# Patient Record
Sex: Female | Born: 1985 | Race: Black or African American | Hispanic: No | Marital: Single | Smoking: Never smoker
Health system: Southern US, Community
[De-identification: ages and names within clinical notes are randomized; demographics above are authoritative.]

## PROBLEM LIST (undated history)

## (undated) DIAGNOSIS — I10 Essential (primary) hypertension: Secondary | ICD-10-CM

## (undated) HISTORY — PX: TONSILLECTOMY: SUR1361

## (undated) HISTORY — PX: TUBAL LIGATION: SHX77

---

## 2015-12-17 ENCOUNTER — Emergency Department
Admission: EM | Admit: 2015-12-17 | Discharge: 2015-12-18 | Disposition: A | Discharge status: Home / Self Care | Payer: Medicaid Other | Attending: Emergency Medicine | Admitting: Emergency Medicine

## 2015-12-17 ENCOUNTER — Encounter: Payer: Self-pay | Admitting: Emergency Medicine

## 2015-12-17 DIAGNOSIS — K0889 Other specified disorders of teeth and supporting structures: Secondary | ICD-10-CM

## 2015-12-17 DIAGNOSIS — K08409 Partial loss of teeth, unspecified cause, unspecified class: Secondary | ICD-10-CM | POA: Diagnosis not present

## 2015-12-17 DIAGNOSIS — K2970 Gastritis, unspecified, without bleeding: Secondary | ICD-10-CM | POA: Diagnosis not present

## 2015-12-17 DIAGNOSIS — I10 Essential (primary) hypertension: Secondary | ICD-10-CM | POA: Insufficient documentation

## 2015-12-17 DIAGNOSIS — R112 Nausea with vomiting, unspecified: Secondary | ICD-10-CM | POA: Diagnosis present

## 2015-12-17 HISTORY — DX: Essential (primary) hypertension: I10

## 2015-12-17 NOTE — ED Notes (Addendum)
Pt presents to ED with tooth pain to the bottom left side of her mouth. Pt reports she has a dental appt on Tuesday but feels like she cant wait any longer. Pt also reports she has been vomiting since this morning and is feeling dehydrated. Denies drainage from affected tooth. States otc medications are not helping.

## 2015-12-18 MED ORDER — NAPROXEN 500 MG PO TABS
ORAL_TABLET | ORAL | Status: AC
  Administered 2015-12-18: 500 mg via ORAL
  Filled 2015-12-18: qty 1

## 2015-12-18 MED ORDER — RANITIDINE HCL 150 MG PO CAPS: 150.0000 mg | ORAL_CAPSULE | Freq: Two times a day (BID) | ORAL | Status: DC

## 2015-12-18 MED ORDER — GI COCKTAIL ~~LOC~~
30.0000 mL | Freq: Once | ORAL | Status: AC
  Administered 2015-12-18: 30 mL via ORAL
  Filled 2015-12-18: qty 30

## 2015-12-18 MED ORDER — NAPROXEN 500 MG PO TABS
500.0000 mg | ORAL_TABLET | Freq: Once | ORAL | Status: AC
  Administered 2015-12-18: 500 mg via ORAL

## 2015-12-18 MED ORDER — ONDANSETRON 8 MG PO TBDP: 8.0000 mg | ORAL_TABLET | Freq: Three times a day (TID) | ORAL | Status: DC | PRN

## 2015-12-18 MED ORDER — SUCRALFATE 1 G PO TABS: 1.0000 g | ORAL_TABLET | Freq: Four times a day (QID) | ORAL | Status: DC

## 2015-12-18 MED ORDER — ONDANSETRON 8 MG PO TBDP
8.0000 mg | ORAL_TABLET | Freq: Once | ORAL | Status: AC
  Administered 2015-12-18: 8 mg via ORAL
  Filled 2015-12-18: qty 1

## 2015-12-18 MED ORDER — BENZOCAINE 20 % MT GEL: 1.0000 "application " | Freq: Four times a day (QID) | OROMUCOSAL | Status: DC | PRN

## 2015-12-18 NOTE — ED Notes (Signed)
Pt was resting upon entering room. Pt given fluids and instructed to take small sips slowly. Will monitor pt. Pt denies nausea at this time and has not vomited since medication given.

## 2015-12-18 NOTE — Discharge Instructions (Signed)

## 2015-12-18 NOTE — ED Provider Notes (Signed)
[facility] Emergency Department Provider Note  ____________________________________________  Time seen: 12:00 midnight  I have reviewed the triage vital signs and the nursing notes.   HISTORY  Chief Complaint Dental Pain and Emesis    HPI Makayla Hernandez is a 29 y.o. female who complains of dental pain in the bottom left of her mouth and she has a dental appointment in 2 days but feels like she can't wait any longer. Part of the issue that motivated her presentation tonight is that he says that she's been taking multiple NSAIDs for the past 2 or 3 days including Tylenol and ibuprofen around-the-clock and now today she is very nauseated and has been vomiting and unable to tolerate oral intake. Says she is throwing up "the lining of my stomach". Has any fevers chills or abdominal pain. No chest pain shortness of breath headaches or dizziness. No throat swelling.     Past Medical History  Diagnosis Date  . HTN (hypertension)      There are no active problems to display for this patient.    Past Surgical History  Procedure Laterality Date  . Cesarean section    . Tonsillectomy    . Tubal ligation       Current Outpatient Rx  Name  Route  Sig  Dispense  Refill  . benzocaine (HURRICAINE) 20 % GEL   Mouth/Throat   Use as directed 1 application in the mouth or throat 4 (four) times daily as needed.   5.25 g   0   . ondansetron (ZOFRAN ODT) 8 MG disintegrating tablet   Oral   Take 1 tablet (8 mg total) by mouth every 8 (eight) hours as needed for nausea or vomiting.   20 tablet   0   . ranitidine (ZANTAC) 150 MG capsule   Oral   Take 1 capsule (150 mg total) by mouth 2 (two) times daily.   28 capsule   0   . sucralfate (CARAFATE) 1 G tablet   Oral   Take 1 tablet (1 g total) by mouth 4 (four) times daily.   120 tablet   1      Allergies Review of patient's allergies indicates no known allergies.   No family history on  file.  Social History Social History  Substance Use Topics  . Smoking status: Never Smoker   . Smokeless tobacco: Never Used  . Alcohol Use: No    Review of Systems  Constitutional:   No fever or chills. No weight changes Eyes:   No blurry vision or double vision.  ENT:   No sore throat. Dental pain Cardiovascular:   No chest pain. Respiratory:   No dyspnea or cough. Gastrointestinal:   Negative for abdominal pain, positive vomiting.  No BRBPR or melena. Genitourinary:   Negative for dysuria, urinary retention, bloody urine, or difficulty urinating. Musculoskeletal:   Negative for back pain. No joint swelling or pain. Skin:   Negative for rash. Neurological:   Negative for headaches, focal weakness or numbness. Psychiatric:  No anxiety or depression.   Endocrine:  No hot/cold intolerance, changes in energy, or sleep difficulty.  10-point ROS otherwise negative.  ____________________________________________   PHYSICAL EXAM:  VITAL SIGNS: ED Triage Vitals  Enc Vitals Group     BP 12/17/15 2335 160/91 mmHg     Pulse Rate 12/17/15 2335 81     Resp 12/17/15 2335 18     Temp 12/17/15 2335 98.2 F (36.8 C)     Temp  Source 12/17/15 2335 Oral     SpO2 12/17/15 2335 100 %     Weight 12/17/15 2335 270 lb (122.471 kg)     Height 12/17/15 2335 5\' 7"  (1.702 m)     Head Cir --      Peak Flow --      Pain Score 12/17/15 2341 10     Pain Loc --      Pain Edu? --      Excl. in GC? --     Vital signs reviewed, nursing assessments reviewed.   Constitutional:   Alert and oriented. Well appearing and in no distress. Eyes:   No scleral icterus. No conjunctival pallor. PERRL. EOMI ENT   Head:   Normocephalic and atraumatic.   Nose:   No congestion/rhinnorhea. No septal hematoma   Mouth/Throat:   MMM, no pharyngeal erythema. No peritonsillar mass. No uvula shift. Multiple missing teeth. No swelling or apparent inflammation in the area of the indicated pain. The indicated  tooth and his neighbors are completely well seated stable and nontender. No visible injury. No floor of mouth swelling or edema., 4 mouth is soft to touch.   Neck:   No stridor. No SubQ emphysema. No meningismus. Hematological/Lymphatic/Immunilogical:   No cervical lymphadenopathy. Cardiovascular:   RRR. Normal and symmetric distal pulses are present in all extremities. No murmurs, rubs, or gallops. Respiratory:   Normal respiratory effort without tachypnea nor retractions. Breath sounds are clear and equal bilaterally. No wheezes/rales/rhonchi. Gastrointestinal:   Soft and nontender. No distention. There is no CVA tenderness.  No rebound, rigidity, or guarding. Genitourinary:   deferred Musculoskeletal:   Nontender with normal range of motion in all extremities. No joint effusions.  No lower extremity tenderness.  No edema. Neurologic:   Normal speech and language.  CN 2-10 normal. Motor grossly intact. No pronator drift.  Normal gait. No gross focal neurologic deficits are appreciated.  Skin:    Skin is warm, dry and intact. No rash noted.  No petechiae, purpura, or bullae. Psychiatric:   Mood and affect are normal. Speech and behavior are normal. Patient exhibits appropriate insight and judgment.  ____________________________________________    LABS (pertinent positives/negatives) (all labs ordered are listed, but only abnormal results are displayed) Labs Reviewed - No data to display ____________________________________________   EKG    ____________________________________________    RADIOLOGY    ____________________________________________   PROCEDURES   ____________________________________________   INITIAL IMPRESSION / ASSESSMENT AND PLAN / ED COURSE  Pertinent labs & imaging results that were available during my care of the patient were reviewed by me and considered in my medical decision making (see chart for details).  Patient very well-appearing no  acute distress. No significant dental tenderness or inflammation, no abscess. Patient states that she is very concerned that she is dehydrated and needs IV fluids. On exam there is no evidence of dehydration and she is very well-appearing. I encouraged her to try oral antiemetics and a GI cocktail first to a the patient is quite resistant, but we'll give it a try as she does not appear to warrant IV therapy at this time. I suspect was happened is that with her round-the-clock use of multiple NSAIDs she started to develop a gastritis and is causing the new symptoms. I very low suspicion of any airway optimize or PTA or RPA or secondary space infection.  ----------------------------------------- 2:02 AM on 12/18/2015 -----------------------------------------  Sleeping comfortably. Tolerating oral intake and drinking a full cup of water after antiemetics and  GI cocktail. We'll discharge home, follow up with dentistry.   ____________________________________________   FINAL CLINICAL IMPRESSION(S) / ED DIAGNOSES  Final diagnoses:  Gastritis  Pain, dental      Sharman Cheek, MD 12/18/15 [phone]

## 2016-01-12 ENCOUNTER — Emergency Department
Admission: EM | Admit: 2016-01-12 | Discharge: 2016-01-13 | Disposition: A | Discharge status: Home / Self Care | Payer: Medicaid Other | Attending: Emergency Medicine | Admitting: Emergency Medicine

## 2016-01-12 ENCOUNTER — Other Ambulatory Visit: Payer: Self-pay

## 2016-01-12 DIAGNOSIS — Z79899 Other long term (current) drug therapy: Secondary | ICD-10-CM | POA: Diagnosis not present

## 2016-01-12 DIAGNOSIS — H53149 Visual discomfort, unspecified: Secondary | ICD-10-CM | POA: Diagnosis not present

## 2016-01-12 DIAGNOSIS — I10 Essential (primary) hypertension: Secondary | ICD-10-CM | POA: Insufficient documentation

## 2016-01-12 DIAGNOSIS — R51 Headache: Secondary | ICD-10-CM | POA: Diagnosis present

## 2016-01-12 DIAGNOSIS — R110 Nausea: Secondary | ICD-10-CM | POA: Diagnosis not present

## 2016-01-12 DIAGNOSIS — R519 Headache, unspecified: Secondary | ICD-10-CM

## 2016-01-12 DIAGNOSIS — F419 Anxiety disorder, unspecified: Secondary | ICD-10-CM | POA: Diagnosis not present

## 2016-01-12 LAB — BASIC METABOLIC PANEL
ANION GAP: 8 (ref 5–15)
BUN: 9 mg/dL (ref 6–20)
CHLORIDE: 105 mmol/L (ref 101–111)
CO2: 23 mmol/L (ref 22–32)
Calcium: 8.8 mg/dL — ABNORMAL LOW (ref 8.9–10.3)
Creatinine, Ser: 0.95 mg/dL (ref 0.44–1.00)
GFR calc Af Amer: >60 mL/min (ref >60)
GLUCOSE: 101 mg/dL — AB (ref 65–99)
POTASSIUM: 3.3 mmol/L — AB (ref 3.5–5.1)
Sodium: 136 mmol/L (ref 135–145)

## 2016-01-12 LAB — CBC
HEMATOCRIT: 32.5 % — AB (ref 35.0–47.0)
Hemoglobin: 10 g/dL — ABNORMAL LOW (ref 12.0–16.0)
MCH: 19.6 pg — AB (ref 26.0–34.0)
MCHC: 30.7 g/dL — ABNORMAL LOW (ref 32.0–36.0)
MCV: 63.9 fL — AB (ref 80.0–100.0)
PLATELETS: 177 10*3/uL (ref 150–440)
RBC: 5.08 MIL/uL (ref 3.80–5.20)
RDW: 19.4 % — ABNORMAL HIGH (ref 11.5–14.5)
WBC: 5.8 10*3/uL (ref 3.6–11.0)

## 2016-01-12 MED ORDER — ONDANSETRON 4 MG PO TBDP
4.0000 mg | ORAL_TABLET | Freq: Once | ORAL | Status: AC
  Administered 2016-01-12: 4 mg via ORAL
  Filled 2016-01-12: qty 1

## 2016-01-12 MED ORDER — AMLODIPINE BESYLATE 5 MG PO TABS
5.0000 mg | ORAL_TABLET | Freq: Once | ORAL | Status: AC
  Administered 2016-01-12: 5 mg via ORAL
  Filled 2016-01-12: qty 1

## 2016-01-12 NOTE — ED Notes (Addendum)
Spoke with MD about pt symptoms and received orders for evaluation and treatment of headache. Pt unable to give urine sample at this time, provided cup and instructed of need for sample. PT given warm blankets.

## 2016-01-12 NOTE — ED Notes (Signed)
Headache since Wednesday, nausea, light sensitivity. Pt recently started back on amlodipine for blood pressure, 2 weeks ago, which she had been on in previous years. Pt alert and oriented X4, active, cooperative, pt in NAD. RR even and unlabored, color WNL.

## 2016-01-13 ENCOUNTER — Emergency Department: Payer: Medicaid Other

## 2016-01-13 LAB — URINALYSIS COMPLETE WITH MICROSCOPIC (ARMC ONLY)
Bilirubin Urine: NEGATIVE
GLUCOSE, UA: NEGATIVE mg/dL
HGB URINE DIPSTICK: NEGATIVE
LEUKOCYTES UA: NEGATIVE
Nitrite: NEGATIVE
PROTEIN: NEGATIVE mg/dL
Specific Gravity, Urine: 1.012 (ref 1.005–1.030)
pH: 5 (ref 5.0–8.0)

## 2016-01-13 MED ORDER — BUTALBITAL-APAP-CAFFEINE 50-325-40 MG PO TABS: 1.0000 | ORAL_TABLET | Freq: Four times a day (QID) | ORAL | Status: AC | PRN

## 2016-01-13 MED ORDER — METOCLOPRAMIDE HCL 5 MG/ML IJ SOLN: 20.0000 mg | Freq: Once | INTRAVENOUS | Status: DC

## 2016-01-13 MED ORDER — SODIUM CHLORIDE 0.9 % IV SOLN
1000.0000 mL | Freq: Once | INTRAVENOUS | Status: AC
  Administered 2016-01-13: 1000 mL via INTRAVENOUS

## 2016-01-13 MED ORDER — METOCLOPRAMIDE HCL 5 MG/ML IJ SOLN: 20.0000 mg | Freq: Once | INTRAMUSCULAR | Status: AC

## 2016-01-13 MED ORDER — KETOROLAC TROMETHAMINE 30 MG/ML IJ SOLN
30.0000 mg | Freq: Once | INTRAMUSCULAR | Status: AC
  Administered 2016-01-13: 30 mg via INTRAVENOUS
  Filled 2016-01-13: qty 1

## 2016-01-13 MED ORDER — DIPHENHYDRAMINE HCL 50 MG/ML IJ SOLN
25.0000 mg | Freq: Once | INTRAMUSCULAR | Status: AC
  Administered 2016-01-13: 25 mg via INTRAVENOUS
  Filled 2016-01-13: qty 1

## 2016-01-13 MED ORDER — METOCLOPRAMIDE HCL 5 MG/ML IJ SOLN
INTRAMUSCULAR | Status: AC
  Administered 2016-01-13: 20 mg
  Filled 2016-01-13: qty 4

## 2016-01-13 NOTE — ED Notes (Addendum)
Verbal directions received from Dr Cyril LoosenKinner: Metoclopramide (Reglan) can be given IVP with the infusing 0.9% NS bolus instead of being mixed with a 50mL D5 IVPB.

## 2016-01-13 NOTE — ED Notes (Signed)
Patient transported to CT 

## 2016-01-13 NOTE — ED Provider Notes (Signed)
[facility] Emergency Department Provider Note  ____________________________________________  Time seen: On arrival  I have reviewed the triage vital signs and the nursing notes.   HISTORY  Chief Complaint Headache and Hypertension    HPI Makayla Hernandez is a 30 y.o. female who presents with complaints of headache for approximately 3 days. She reports the headache is global in nature and throbbing in 7 or 8 out of 10. She denies neuro deficits. No fevers no chills. She does report photophobia. She is had nausea. She is never had a headache last this long before. She feels this may be related to high blood pressure which she recently started taking medications for again because of insurance issues.    Past Medical History  Diagnosis Date  . HTN (hypertension)     There are no active problems to display for this patient.   Past Surgical History  Procedure Laterality Date  . Cesarean section    . Tonsillectomy    . Tubal ligation      Current Outpatient Rx  Name  Route  Sig  Dispense  Refill  . benzocaine (HURRICAINE) 20 % GEL   Mouth/Throat   Use as directed 1 application in the mouth or throat 4 (four) times daily as needed.   5.25 g   0   . ondansetron (ZOFRAN ODT) 8 MG disintegrating tablet   Oral   Take 1 tablet (8 mg total) by mouth every 8 (eight) hours as needed for nausea or vomiting.   20 tablet   0   . ranitidine (ZANTAC) 150 MG capsule   Oral   Take 1 capsule (150 mg total) by mouth 2 (two) times daily.   28 capsule   0   . sucralfate (CARAFATE) 1 G tablet   Oral   Take 1 tablet (1 g total) by mouth 4 (four) times daily.   120 tablet   1     Allergies Review of patient's allergies indicates no known allergies.  No family history on file.  Social History Social History  Substance Use Topics  . Smoking status: Never Smoker   . Smokeless tobacco: Never Used  . Alcohol Use: No    Review of  Systems  Constitutional: Negative for fever. Eyes: Positive for photophobia ENT: No difficulty swallowing Cardiovascular: Negative for chest pain. Respiratory: Negative for shortness of breath. Gastrointestinal: Nausea for nausea Genitourinary: Negative for dysuria. Musculoskeletal: Negative for back pain. Negative for neck pain Skin: Negative for rash. Neurological: Negative for  focal weakness Psychiatric:no Anxiety  ________________________________________   PHYSICAL EXAM:  VITAL SIGNS: ED Triage Vitals  Enc Vitals Group     BP 01/12/16 2240 174/110 mmHg     Pulse Rate 01/12/16 2240 93     Resp 01/12/16 2240 18     Temp 01/12/16 2240 98.1 F (36.7 C)     Temp Source 01/12/16 2240 Oral     SpO2 01/12/16 2241 100 %     Weight 01/12/16 2241 275 lb (124.739 kg)     Height 01/12/16 2241 5\' 10"  (1.778 m)     Head Cir --      Peak Flow --      Pain Score 01/13/16 0000 10     Pain Loc --      Pain Edu? --      Excl. in GC? --      Constitutional: Alert and oriented. Well appearing and in no distress. Eyes: Conjunctivae are normal.  ENT  Head: Normocephalic and atraumatic.   Mouth/Throat: Mucous membranes are moist. Cardiovascular: Normal rate, regular rhythm. Normal and symmetric distal pulses are present in all extremities.  Respiratory: Normal respiratory effort without tachypnea nor retractions. Breath sounds are clear and equal bilaterally.  Gastrointestinal: Soft and non-tender in all quadrants. No distention. There is no CVA tenderness. Genitourinary: deferred Musculoskeletal: Nontender with normal range of motion in all extremities. No lower extremity tenderness nor edema. Neurologic:  Normal speech and language. No gross focal neurologic deficits are appreciated. Skin:  Skin is warm, dry and intact. No rash noted. Psychiatric: Mood and affect are normal. Patient exhibits appropriate insight and judgment.  ____________________________________________     LABS (pertinent positives/negatives)  Labs Reviewed  CBC - Abnormal; Notable for the following:    Hemoglobin 10.0 (*)    HCT 32.5 (*)    MCV 63.9 (*)    MCH 19.6 (*)    MCHC 30.7 (*)    RDW 19.4 (*)    All other components within normal limits  BASIC METABOLIC PANEL - Abnormal; Notable for the following:    Potassium 3.3 (*)    Glucose, Bld 101 (*)    Calcium 8.8 (*)    All other components within normal limits  URINALYSIS COMPLETEWITH MICROSCOPIC ([facility] ONLY)    ____________________________________________   EKG  ED ECG REPORT I, Jene EveryKINNER, Taelar Gronewold, the attending physician, personally viewed and interpreted this ECG.  Date: 01/13/2016 EKG Time: 10:53 PM Rate: 84 Rhythm: normal sinus rhythm QRS Axis: normal Intervals: normal ST/T Wave abnormalities: normal Conduction Disutrbances: none Narrative Interpretation: unremarkable   ____________________________________________    RADIOLOGY I have personally reviewed any xrays that were ordered on this patient CT head unremarkable ____________________________________________   PROCEDURES  Procedure(s) performed: none  Critical Care performed: none  ____________________________________________   INITIAL IMPRESSION / ASSESSMENT AND PLAN / ED COURSE  Pertinent labs & imaging results that were available during my care of the patient were reviewed by me and considered in my medical decision making (see chart for details)  History of present illness most consistent with migraine headache. We will give Reglan and Benadryl and Toradol if CT is negative.  ----------------------------------------- 1:31 AM on 01/13/2016 -----------------------------------------  Patient with significant improvement in headache and in fact her blood pressure is improved without intervention  ____________________________________________ ----------------------------------------- 3:50 AM on  01/13/2016 -----------------------------------------  Patient reports headache is completely resolved. I will discharge her with outpatient follow-up and a prescription of Fioricet   FINAL CLINICAL IMPRESSION(S) / ED DIAGNOSES  Final diagnoses:  Acute nonintractable headache, unspecified headache type     Jene Everyobert Thurston Brendlinger, MD 01/13/16 [phone]

## 2016-01-13 NOTE — ED Notes (Addendum)
Cyril LoosenKinner, MD and Gwynneth MunsonButch, RN at bedside.

## 2016-01-13 NOTE — Discharge Instructions (Signed)

## 2016-11-19 ENCOUNTER — Emergency Department: Payer: Medicaid Other

## 2016-11-19 ENCOUNTER — Emergency Department
Admission: EM | Admit: 2016-11-19 | Discharge: 2016-11-19 | Disposition: A | Discharge status: Home / Self Care | Payer: Medicaid Other | Attending: Emergency Medicine | Admitting: Emergency Medicine

## 2016-11-19 ENCOUNTER — Encounter: Payer: Self-pay | Admitting: Emergency Medicine

## 2016-11-19 DIAGNOSIS — I10 Essential (primary) hypertension: Secondary | ICD-10-CM | POA: Diagnosis not present

## 2016-11-19 DIAGNOSIS — R079 Chest pain, unspecified: Secondary | ICD-10-CM | POA: Diagnosis present

## 2016-11-19 DIAGNOSIS — Z79899 Other long term (current) drug therapy: Secondary | ICD-10-CM | POA: Diagnosis not present

## 2016-11-19 DIAGNOSIS — Z5321 Procedure and treatment not carried out due to patient leaving prior to being seen by health care provider: Secondary | ICD-10-CM | POA: Diagnosis not present

## 2016-11-19 LAB — CBC
HEMATOCRIT: 32.4 % — AB (ref 35.0–47.0)
Hemoglobin: 9.7 g/dL — ABNORMAL LOW (ref 12.0–16.0)
MCH: 19.5 pg — ABNORMAL LOW (ref 26.0–34.0)
MCHC: 29.9 g/dL — ABNORMAL LOW (ref 32.0–36.0)
MCV: 65.1 fL — ABNORMAL LOW (ref 80.0–100.0)
PLATELETS: 222 10*3/uL (ref 150–440)
RBC: 4.98 MIL/uL (ref 3.80–5.20)
RDW: 19.6 % — AB (ref 11.5–14.5)
WBC: 7.1 10*3/uL (ref 3.6–11.0)

## 2016-11-19 LAB — BASIC METABOLIC PANEL
Anion gap: 8 (ref 5–15)
BUN: 11 mg/dL (ref 6–20)
CALCIUM: 8.7 mg/dL — AB (ref 8.9–10.3)
CO2: 22 mmol/L (ref 22–32)
CREATININE: 0.88 mg/dL (ref 0.44–1.00)
Chloride: 109 mmol/L (ref 101–111)
GFR calc Af Amer: >60 mL/min (ref >60)
Glucose, Bld: 105 mg/dL — ABNORMAL HIGH (ref 65–99)
POTASSIUM: 3.3 mmol/L — AB (ref 3.5–5.1)
SODIUM: 139 mmol/L (ref 135–145)

## 2016-11-19 LAB — TROPONIN I

## 2016-11-19 NOTE — ED Triage Notes (Signed)
Patient ambulatory to triage with steady gait, without difficulty or distress noted; pt reports mid CP since yesterday accomp by by cough

## 2017-01-11 ENCOUNTER — Emergency Department
Admission: EM | Admit: 2017-01-11 | Discharge: 2017-01-11 | Disposition: A | Discharge status: Home / Self Care | Payer: Medicaid Other | Attending: Emergency Medicine | Admitting: Emergency Medicine

## 2017-01-11 DIAGNOSIS — I10 Essential (primary) hypertension: Secondary | ICD-10-CM | POA: Insufficient documentation

## 2017-01-11 DIAGNOSIS — R42 Dizziness and giddiness: Secondary | ICD-10-CM | POA: Diagnosis present

## 2017-01-11 DIAGNOSIS — Z79899 Other long term (current) drug therapy: Secondary | ICD-10-CM | POA: Insufficient documentation

## 2017-01-11 LAB — CBC
HEMATOCRIT: 30.8 % — AB (ref 35.0–47.0)
Hemoglobin: 9.4 g/dL — ABNORMAL LOW (ref 12.0–16.0)
MCH: 19.4 pg — ABNORMAL LOW (ref 26.0–34.0)
MCHC: 30.6 g/dL — AB (ref 32.0–36.0)
MCV: 63.4 fL — ABNORMAL LOW (ref 80.0–100.0)
PLATELETS: 186 10*3/uL (ref 150–440)
RBC: 4.87 MIL/uL (ref 3.80–5.20)
RDW: 19 % — AB (ref 11.5–14.5)
WBC: 5.6 10*3/uL (ref 3.6–11.0)

## 2017-01-11 LAB — BASIC METABOLIC PANEL
Anion gap: 6 (ref 5–15)
BUN: 10 mg/dL (ref 6–20)
CO2: 24 mmol/L (ref 22–32)
CREATININE: 0.75 mg/dL (ref 0.44–1.00)
Calcium: 8.7 mg/dL — ABNORMAL LOW (ref 8.9–10.3)
Chloride: 110 mmol/L (ref 101–111)
GFR calc Af Amer: >60 mL/min (ref >60)
GLUCOSE: 85 mg/dL (ref 65–99)
POTASSIUM: 3.6 mmol/L (ref 3.5–5.1)
SODIUM: 140 mmol/L (ref 135–145)

## 2017-01-11 MED ORDER — KETOROLAC TROMETHAMINE 30 MG/ML IJ SOLN
INTRAMUSCULAR | Status: AC
  Filled 2017-01-11: qty 1

## 2017-01-11 MED ORDER — MECLIZINE HCL 25 MG PO TABS: 25.0000 mg | ORAL_TABLET | Freq: Three times a day (TID) | ORAL | 1 refills | Status: DC | PRN

## 2017-01-11 MED ORDER — DIAZEPAM 2 MG PO TABS
ORAL_TABLET | ORAL | Status: DC
Start: 2017-01-11 — End: 2017-01-11
  Filled 2017-01-11: qty 1

## 2017-01-11 NOTE — ED Triage Notes (Signed)
Pt to ER via POV c/o feeling dizzy X 2 days. Pt believes that it may be due to her hypertension. Pt reports being under stress recently. Pt has been noncompliant with BP meds recently. Denies syncope. Pt ambulatory.

## 2017-01-11 NOTE — ED Provider Notes (Signed)
[facility] Emergency Department Provider Note  ____________________________________________  Time seen: Approximately 7:19 PM  I have reviewed the triage vital signs and the nursing notes.   HISTORY  Chief Complaint Dizziness    HPI Makayla Hernandez is a 31 y.o. female who complains of dizziness for the past 2 days, constant. No aggravating or alleviating factors. No headache syncope vision changes numbness tingling or weakness. Has a history of dizziness before. Related to elevated blood pressure she recently started her antihypertensive again about 3 days ago, taking amlodipine now.  While in the ED, she reports that her symptoms have abated and she is now feeling back to normal and asymptomatic.  No sudden severe onset of symptoms. No vomiting. No loss of balance or coordination     Past Medical History:  Diagnosis Date  . HTN (hypertension)      There are no active problems to display for this patient.    Past Surgical History:  Procedure Laterality Date  . CESAREAN SECTION    . TONSILLECTOMY    . TUBAL LIGATION       Prior to Admission medications   Medication Sig Start Date End Date Taking? Authorizing Provider  benzocaine (HURRICAINE) 20 % GEL Use as directed 1 application in the mouth or throat 4 (four) times daily as needed. 12/18/15   Sharman CheekPhillip Moesha Sarchet, MD  butalbital-acetaminophen-caffeine [facility](FIORICET) [phone] MG tablet Take 1-2 tablets by mouth every 6 (six) hours as needed for headache. 01/13/16 01/12/17  Jene Everyobert Kinner, MD  meclizine (ANTIVERT) 25 MG tablet Take 1 tablet (25 mg total) by mouth 3 (three) times daily as needed for dizziness or nausea. 01/11/17   Sharman CheekPhillip Obrien Huskins, MD  ondansetron (ZOFRAN ODT) 8 MG disintegrating tablet Take 1 tablet (8 mg total) by mouth every 8 (eight) hours as needed for nausea or vomiting. 12/18/15   Sharman CheekPhillip Yohan Samons, MD  ranitidine (ZANTAC) 150 MG capsule Take 1 capsule (150 mg total) by mouth 2 (two)  times daily. 12/18/15   Sharman CheekPhillip Anglia Blakley, MD  sucralfate (CARAFATE) 1 G tablet Take 1 tablet (1 g total) by mouth 4 (four) times daily. 12/18/15   Sharman CheekPhillip Shaida Route, MD     Allergies Patient has no known allergies.   No family history on file.  Social History Social History  Substance Use Topics  . Smoking status: Never Smoker  . Smokeless tobacco: Never Used  . Alcohol use No    Review of Systems  Constitutional:   No fever or chills.  ENT:   No sore throat. No rhinorrhea. Cardiovascular:   No chest pain. Respiratory:   No dyspnea or cough. Gastrointestinal:   Negative for abdominal pain, vomiting and diarrhea.  Genitourinary:   Negative for dysuria or difficulty urinating. Musculoskeletal:   Negative for focal pain or swelling Neurological:   Negative for headaches 10-point ROS otherwise negative.  ____________________________________________   PHYSICAL EXAM:  VITAL SIGNS: ED Triage Vitals [01/11/17 1645]  Enc Vitals Group     BP (!) 144/94     Pulse Rate 76     Resp 18     Temp 98.3 F (36.8 C)     Temp Source Oral     SpO2 100 %     Weight 260 lb (117.9 kg)     Height 5\' 7"  (1.702 m)     Head Circumference      Peak Flow      Pain Score      Pain Loc      Pain  Edu?      Excl. in GC?     Vital signs reviewed, nursing assessments reviewed.   Constitutional:   Alert and oriented. Well appearing and in no distress. Eyes:   No scleral icterus. No conjunctival pallor. PERRL. EOMI.  No nystagmus. ENT   Head:   Normocephalic and atraumatic.TMs normal bilaterally   Nose:   No congestion/rhinnorhea. No septal hematoma   Mouth/Throat:   MMM, no pharyngeal erythema. No peritonsillar mass.    Neck:   No stridor. No SubQ emphysema. No meningismus. Hematological/Lymphatic/Immunilogical:   No cervical lymphadenopathy. Cardiovascular:   RRR. Symmetric bilateral radial and DP pulses.  No murmurs.  Respiratory:   Normal respiratory effort without  tachypnea nor retractions. Breath sounds are clear and equal bilaterally. No wheezes/rales/rhonchi. Gastrointestinal:   Soft and nontender. Non distended. There is no CVA tenderness.  No rebound, rigidity, or guarding. Genitourinary:   deferred Musculoskeletal:   Nontender with normal range of motion in all extremities. No joint effusions.  No lower extremity tenderness.  No edema. Neurologic:   Normal speech and language.  CN 2-10 normal. Motor grossly intact. No gross focal neurologic deficits are appreciated.  Skin:    Skin is warm, dry and intact. No rash noted.  No petechiae, purpura, or bullae.  ____________________________________________    LABS (pertinent positives/negatives) (all labs ordered are listed, but only abnormal results are displayed) Labs Reviewed  BASIC METABOLIC PANEL - Abnormal; Notable for the following:       Result Value   Calcium 8.7 (*)    All other components within normal limits  CBC - Abnormal; Notable for the following:    Hemoglobin 9.4 (*)    HCT 30.8 (*)    MCV 63.4 (*)    MCH 19.4 (*)    MCHC 30.6 (*)    RDW 19.0 (*)    All other components within normal limits   ____________________________________________   EKG  Interpreted by me  Date: 01/11/2017  Rate: 81  Rhythm: normal sinus rhythm  QRS Axis: normal  Intervals: normal  ST/T Wave abnormalities: normal  Conduction Disutrbances: none  Narrative Interpretation: unremarkable      ____________________________________________    RADIOLOGY    ____________________________________________   PROCEDURES Procedures  ____________________________________________   INITIAL IMPRESSION / ASSESSMENT AND PLAN / ED COURSE  Pertinent labs & imaging results that were available during my care of the patient were reviewed by me and considered in my medical decision making (see chart for details).  Patient well appearing no acute distress, presents with dizziness which has  resolved. Vital signs are normal. Blood pressure 120/80 approximately. Exam is nonfocal and reassuring.Considering the patient's symptoms, medical history, and physical examination today, I have low suspicion for ischemic stroke, intracranial hemorrhage, meningitis, encephalitis, carotid or vertebral dissection, venous sinus thrombosis, MS, intracranial hypertension, glaucoma, CRAO, CRVO, or temporal arteritis. Counseled to try meclizine when necessary. Continue amlodipine, follow-up with primary care.     Clinical Course    ____________________________________________   FINAL CLINICAL IMPRESSION(S) / ED DIAGNOSES  Final diagnoses:  Dizziness      New Prescriptions   MECLIZINE (ANTIVERT) 25 MG TABLET    Take 1 tablet (25 mg total) by mouth 3 (three) times daily as needed for dizziness or nausea.     Portions of this note were generated with dragon dictation software. Dictation errors may occur despite best attempts at proofreading.    Sharman Cheek, MD 01/11/17 1921

## 2017-08-29 ENCOUNTER — Emergency Department
Admission: EM | Admit: 2017-08-29 | Discharge: 2017-08-29 | Disposition: A | Discharge status: Home / Self Care | Payer: Medicaid Other | Attending: Emergency Medicine | Admitting: Emergency Medicine

## 2017-08-29 ENCOUNTER — Encounter: Payer: Self-pay | Admitting: Emergency Medicine

## 2017-08-29 ENCOUNTER — Emergency Department: Payer: Medicaid Other

## 2017-08-29 DIAGNOSIS — S8991XA Unspecified injury of right lower leg, initial encounter: Secondary | ICD-10-CM | POA: Diagnosis not present

## 2017-08-29 DIAGNOSIS — Y939 Activity, unspecified: Secondary | ICD-10-CM | POA: Diagnosis not present

## 2017-08-29 DIAGNOSIS — W108XXA Fall (on) (from) other stairs and steps, initial encounter: Secondary | ICD-10-CM | POA: Insufficient documentation

## 2017-08-29 DIAGNOSIS — I10 Essential (primary) hypertension: Secondary | ICD-10-CM | POA: Insufficient documentation

## 2017-08-29 DIAGNOSIS — Y999 Unspecified external cause status: Secondary | ICD-10-CM | POA: Insufficient documentation

## 2017-08-29 DIAGNOSIS — Z79899 Other long term (current) drug therapy: Secondary | ICD-10-CM | POA: Insufficient documentation

## 2017-08-29 DIAGNOSIS — Y929 Unspecified place or not applicable: Secondary | ICD-10-CM | POA: Insufficient documentation

## 2017-08-29 MED ORDER — MELOXICAM 15 MG PO TABS: 15.0000 mg | ORAL_TABLET | Freq: Every day | ORAL | 0 refills | Status: DC

## 2017-08-29 MED ORDER — HYDROCODONE-ACETAMINOPHEN 5-325 MG PO TABS: 1.0000 | ORAL_TABLET | ORAL | 0 refills | Status: DC | PRN

## 2017-08-29 NOTE — ED Notes (Signed)
Pt alert and oriented X4, active, cooperative, pt in NAD. RR even and unlabored, color WNL.  Pt informed to return if any life threatening symptoms occur.   

## 2017-08-29 NOTE — ED Provider Notes (Signed)
[facility] Emergency Department Provider Note ____________________________________________  Time seen: Approximately 10:47 AM  I have reviewed the triage vital signs and the nursing notes.   HISTORY  Chief Complaint Fall and Knee Pain    HPI Makayla SolarCeleste Hernandez is a 31 y.o. female who presents to the emergency department for treatment evaluation of right knee pain. She states that this morning while she waswalking down a step, her right knee "gave out" and caused her to fall directly onto it. She states that she has a history of tendinitis in the right knee, but has never had any definitive imaging and has not been evaluated by an orthopedist. She has not taken any medications since the injury this morning. She denies loss of consciousness or any other complaints.  Past Medical History:  Diagnosis Date  . HTN (hypertension)     There are no active problems to display for this patient.   Past Surgical History:  Procedure Laterality Date  . CESAREAN SECTION    . TONSILLECTOMY    . TUBAL LIGATION      Prior to Admission medications   Medication Sig Start Date End Date Taking? Authorizing Provider  benzocaine (HURRICAINE) 20 % GEL Use as directed 1 application in the mouth or throat 4 (four) times daily as needed. 12/18/15   Sharman CheekStafford, Phillip, MD  HYDROcodone-acetaminophen (NORCO/VICODIN) 5-325 MG tablet Take 1 tablet by mouth every 4 (four) hours as needed for moderate pain. 08/29/17 08/29/18  Arilynn Blakeney, Kasandra Knudsenari B, FNP  meclizine (ANTIVERT) 25 MG tablet Take 1 tablet (25 mg total) by mouth 3 (three) times daily as needed for dizziness or nausea. 01/11/17   Sharman CheekStafford, Phillip, MD  meloxicam (MOBIC) 15 MG tablet Take 1 tablet (15 mg total) by mouth daily. 08/29/17   Rilea Arutyunyan, Rulon Eisenmengerari B, FNP  ondansetron (ZOFRAN ODT) 8 MG disintegrating tablet Take 1 tablet (8 mg total) by mouth every 8 (eight) hours as needed for nausea or vomiting. 12/18/15   Sharman CheekStafford, Phillip, MD   ranitidine (ZANTAC) 150 MG capsule Take 1 capsule (150 mg total) by mouth 2 (two) times daily. 12/18/15   Sharman CheekStafford, Phillip, MD  sucralfate (CARAFATE) 1 G tablet Take 1 tablet (1 g total) by mouth 4 (four) times daily. 12/18/15   Sharman CheekStafford, Phillip, MD    Allergies Patient has no known allergies.  History reviewed. No pertinent family history.  Social History Social History  Substance Use Topics  . Smoking status: Never Smoker  . Smokeless tobacco: Never Used  . Alcohol use No    Review of Systems Constitutional: Negative for recent illness. Cardiovascular: Negative for chest pain. Respiratory: Negative for shortness of breath. Musculoskeletal: Positive for right knee pain. Skin: Negative for wound  Neurological: Negative for radiculopathy or paresthesias.  ____________________________________________   PHYSICAL EXAM:  VITAL SIGNS: ED Triage Vitals  Enc Vitals Group     BP 08/29/17 0911 (!) 133/93     Pulse Rate 08/29/17 0911 76     Resp 08/29/17 0911 16     Temp 08/29/17 0911 98.7 F (37.1 C)     Temp Source 08/29/17 0911 Oral     SpO2 08/29/17 0911 99 %     Weight 08/29/17 0911 286 lb (129.7 kg)     Height 08/29/17 0911 5\' 7"  (1.702 m)     Head Circumference --      Peak Flow --      Pain Score 08/29/17 0914 8     Pain Loc --  Pain Edu? --      Excl. in GC? --     Constitutional: Alert and oriented. Well appearing and in no acute distress. Eyes: Conjunctivae are clear without discharge or drainage.  Head: Atraumatic. Neck: Full, active range of motion observed. Respiratory: Respirations are even and unlabored. Musculoskeletal: Diffuse anterior right knee pain. Pain increases with varus stress. Able to perform straight leg raise. Neurologic: Sharp and dull sensation is intact in the right lower extremity.  Skin: Intact   Psychiatric: Affect and behavior are normal.  ____________________________________________   LABS (all labs ordered are listed,  but only abnormal results are displayed)  Labs Reviewed - No data to display ____________________________________________  RADIOLOGY  Small bony fragment in the anterior intercondylar notch which could reflect anterior cruciate ligament avulsion/injury. ____________________________________________   PROCEDURES  Procedure(s) performed: Knee immobilizer applied by ER tech. Patient neurovascularly intact post-application.  ____________________________________________   INITIAL IMPRESSION / ASSESSMENT AND PLAN / ED COURSE  Makayla Hernandez is a 31 y.o. female who presents to the emergency department after sustaining a mechanical, non-syncopal fall and landed on the right knee. X-ray concerning for an anterior cruciate ligament injury. She was placed in a knee immobilizer and will be advised to call to schedule an appointment with orthopedics. She will be given a prescription for Norco and meloxicam. She will be advised to return to the emergency department for symptoms that change or worsen if unable to schedule an appointment.  Pertinent labs & imaging results that were available during my care of the patient were reviewed by me and considered in my medical decision making (see chart for details).  _________________________________________   FINAL CLINICAL IMPRESSION(S) / ED DIAGNOSES  Final diagnoses:  Knee injury, right, initial encounter    New Prescriptions   HYDROCODONE-ACETAMINOPHEN (NORCO/VICODIN) 5-325 MG TABLET    Take 1 tablet by mouth every 4 (four) hours as needed for moderate pain.   MELOXICAM (MOBIC) 15 MG TABLET    Take 1 tablet (15 mg total) by mouth daily.    If controlled substance prescribed during this visit, 12 month history viewed on the [facility] prior to issuing an initial prescription for Schedule II or III opiod.    Chinita Pester, FNP 08/29/17 1105    Jene Every, MD 08/29/17 1146

## 2017-08-29 NOTE — ED Triage Notes (Signed)
Patient arrives to [facility] to triage s/p fall with knee pain. Patient states her right knee 'gave out' resulting in her falling onto it. Previous history of tendonitis in same joint. NAD Noted

## 2017-10-04 ENCOUNTER — Emergency Department
Admission: EM | Admit: 2017-10-04 | Discharge: 2017-10-04 | Disposition: A | Discharge status: Home / Self Care | Payer: Medicaid Other | Attending: Emergency Medicine | Admitting: Emergency Medicine

## 2017-10-04 ENCOUNTER — Encounter: Payer: Self-pay | Admitting: Emergency Medicine

## 2017-10-04 DIAGNOSIS — X58XXXA Exposure to other specified factors, initial encounter: Secondary | ICD-10-CM | POA: Insufficient documentation

## 2017-10-04 DIAGNOSIS — I10 Essential (primary) hypertension: Secondary | ICD-10-CM | POA: Insufficient documentation

## 2017-10-04 DIAGNOSIS — Y929 Unspecified place or not applicable: Secondary | ICD-10-CM | POA: Diagnosis not present

## 2017-10-04 DIAGNOSIS — Y999 Unspecified external cause status: Secondary | ICD-10-CM | POA: Insufficient documentation

## 2017-10-04 DIAGNOSIS — S4991XA Unspecified injury of right shoulder and upper arm, initial encounter: Secondary | ICD-10-CM | POA: Diagnosis present

## 2017-10-04 DIAGNOSIS — Z791 Long term (current) use of non-steroidal anti-inflammatories (NSAID): Secondary | ICD-10-CM | POA: Insufficient documentation

## 2017-10-04 DIAGNOSIS — S46911A Strain of unspecified muscle, fascia and tendon at shoulder and upper arm level, right arm, initial encounter: Secondary | ICD-10-CM | POA: Diagnosis not present

## 2017-10-04 DIAGNOSIS — Y939 Activity, unspecified: Secondary | ICD-10-CM | POA: Diagnosis not present

## 2017-10-04 NOTE — ED Triage Notes (Signed)
Pt to ED via POV for right shoulder pain x 2 days. Pt denies injury and states that she thinks she may have slept on it wrong but the pain is not going away. Pt is take Ibuprofen OTC without much relief.

## 2017-10-04 NOTE — Discharge Instructions (Signed)
Call orthopedics if not better in 5 to 7 days. Use the mobic and the muscle relaxer you have at home. Do exercises and stretches to help with the shoulder pain.  Apply ice 3 times a day

## 2017-10-04 NOTE — ED Provider Notes (Signed)
[facility] Emergency Department Provider Note  ____________________________________________   First MD Initiated Contact with Patient 10/04/17 1036     (approximate)  I have reviewed the triage vital signs and the nursing notes.   HISTORY  Chief Complaint Shoulder Pain    HPI Makayla Hernandez is a 31 y.o. female complaining of right shoulder pain for 2 days. States it just hurts with movement. No numbness or tingling. No known injury. No chest pain or shortness of breath associated states " I think a slept on it wrong"   Past Medical History:  Diagnosis Date  . HTN (hypertension)     There are no active problems to display for this patient.   Past Surgical History:  Procedure Laterality Date  . CESAREAN SECTION    . TONSILLECTOMY    . TUBAL LIGATION      Prior to Admission medications   Medication Sig Start Date End Date Taking? Authorizing Provider  meloxicam (MOBIC) 15 MG tablet Take 1 tablet (15 mg total) by mouth daily. 08/29/17   Chinita Pester, FNP    Allergies Patient has no known allergies.  No family history on file.  Social History Social History  Substance Use Topics  . Smoking status: Never Smoker  . Smokeless tobacco: Never Used  . Alcohol use No    Review of Systems  Constitutional: No fever/chills Eyes: No visual changes. ENT: No sore throat. Respiratory: Denies cough Genitourinary: Negative for dysuria. Musculoskeletal: Negative for back pain.Positive for right shoulder pain Skin: Negative for rash.    ____________________________________________   PHYSICAL EXAM:  VITAL SIGNS: ED Triage Vitals  Enc Vitals Group     BP 10/04/17 0953 (!) 137/98     Pulse Rate 10/04/17 0953 80     Resp 10/04/17 0953 16     Temp 10/04/17 0953 98.5 F (36.9 C)     Temp Source 10/04/17 0953 Oral     SpO2 10/04/17 0953 99 %     Weight 10/04/17 0952 286 lb (129.7 kg)     Height 10/04/17 0952  (1.702 m)     Head  Circumference --      Peak Flow --      Pain Score 10/04/17 0951 6     Pain Loc --      Pain Edu? --      Excl. in GC? --     Constitutional: Alert and oriented. Well appearing and in no acute distress. Eyes: Conjunctivae are normal.  Head: Atraumatic. Nose: No congestion/rhinnorhea. Mouth/Throat: Mucous membranes are moist.   Cardiovascular: Normal rate, regular rhythm. Respiratory: Normal respiratory effort.  No retractions GU: deferred Musculoskeletal: FROM all extremities, warm and well perfused.  Right shoulder is tender along the anterior aspect. No bony tenderness noted. Patient does have full range of motion although it does reproduce pain on overhead reach. Grips are equal bilaterally. Neck and elbow are nontender Neurologic:  Normal speech and language.  Skin:  Skin is warm, dry and intact. No rash noted. Psychiatric: Mood and affect are normal. Speech and behavior are normal.  ____________________________________________   LABS (all labs ordered are listed, but only abnormal results are displayed)  Labs Reviewed - No data to display ____________________________________________   ____________________________________________  RADIOLOGY    ____________________________________________   PROCEDURES  Procedure(s) performed:       ____________________________________________   INITIAL IMPRESSION / ASSESSMENT AND PLAN / ED COURSE  Pertinent labs & imaging results that were available during my  care of the patient were reviewed by me and considered in my medical decision making (see chart for details).  Acute right shoulder pain / muscle strain. Patient was instructed to apply ice. She has medications at home for Mobic and Flexeril. She will use these medications instead of providing a new prescription. Patient was given a work note as she works 2 jobs and feels that she can't go today.     ____________________________________________   FINAL CLINICAL  IMPRESSION(S) / ED DIAGNOSES  Final diagnoses:  Strain of right shoulder, initial encounter      NEW MEDICATIONS STARTED DURING THIS VISIT:  Current Discharge Medication List       Note:  This document was prepared using Dragon voice recognition software and may include unintentional dictation errors.    Faythe Ghee, PA-C 10/04/17 1057    Jene Every, MD 10/04/17 [phone]

## 2018-02-06 IMAGING — DX DG KNEE COMPLETE 4+V*R*
4 series · 4 of 4 positions shown · non-contrast
Comparison: None.

CLINICAL DATA: Right anterior knee pain after fall.

EXAM:
RIGHT KNEE - COMPLETE 4+ VIEW

[knee ap]
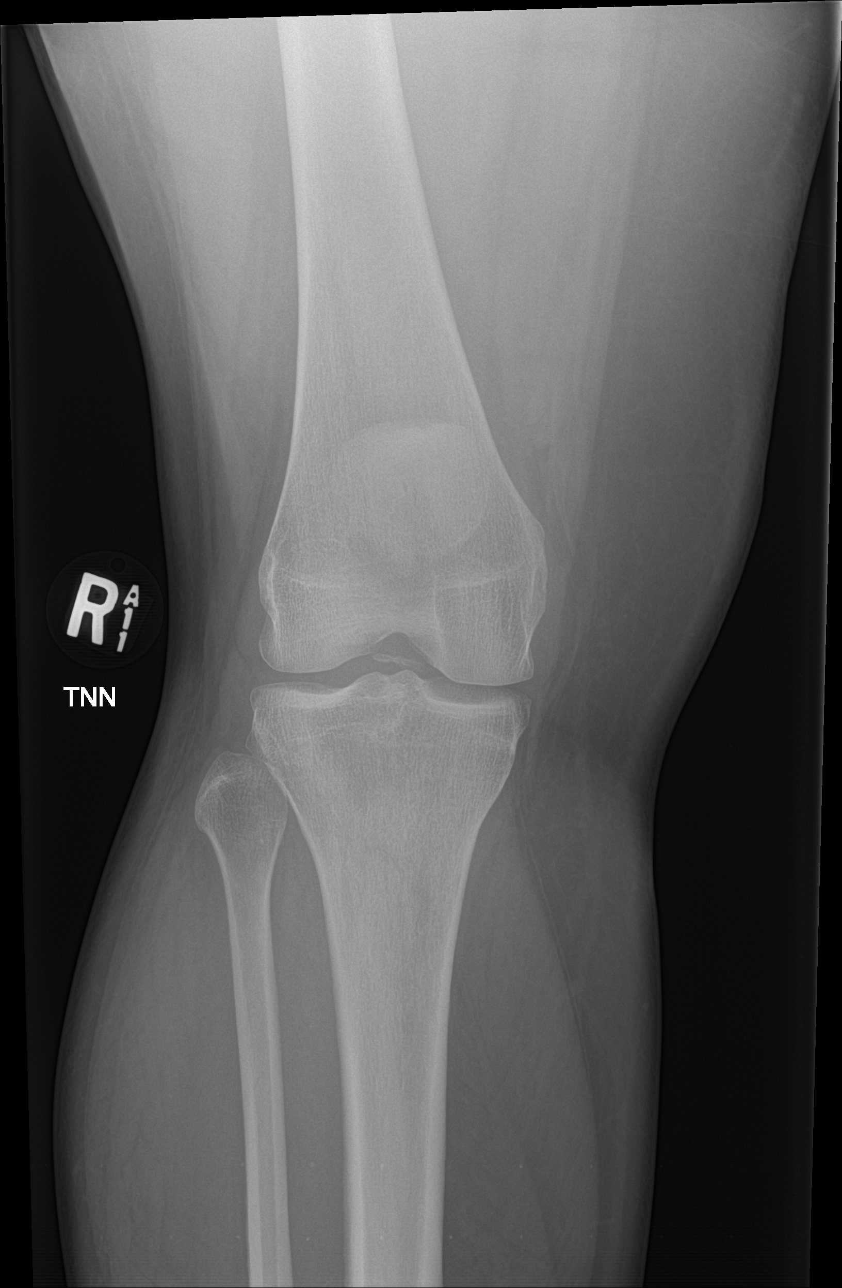

[knee lat]
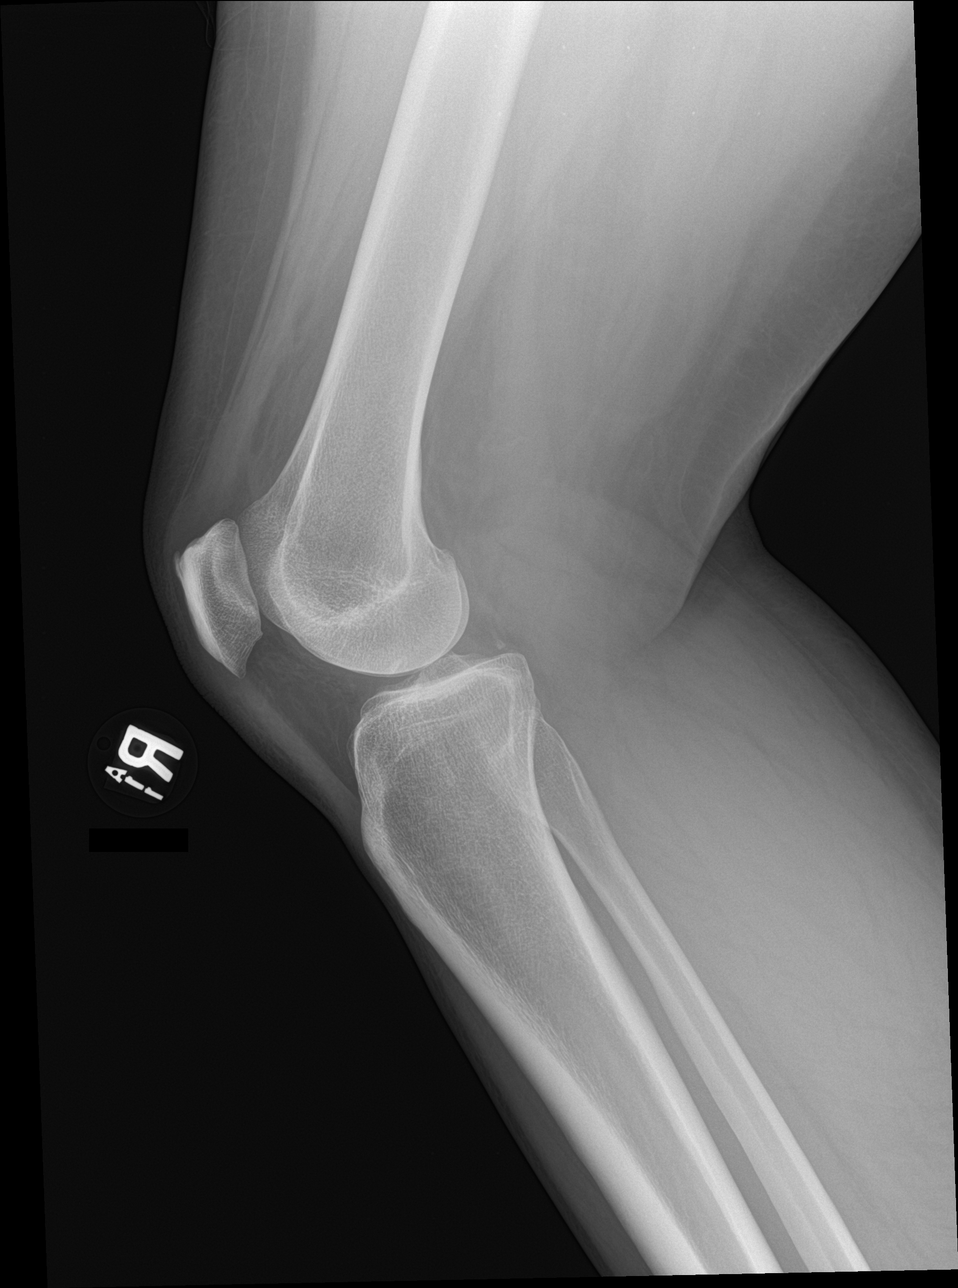

[knee obl (1 of 2)]
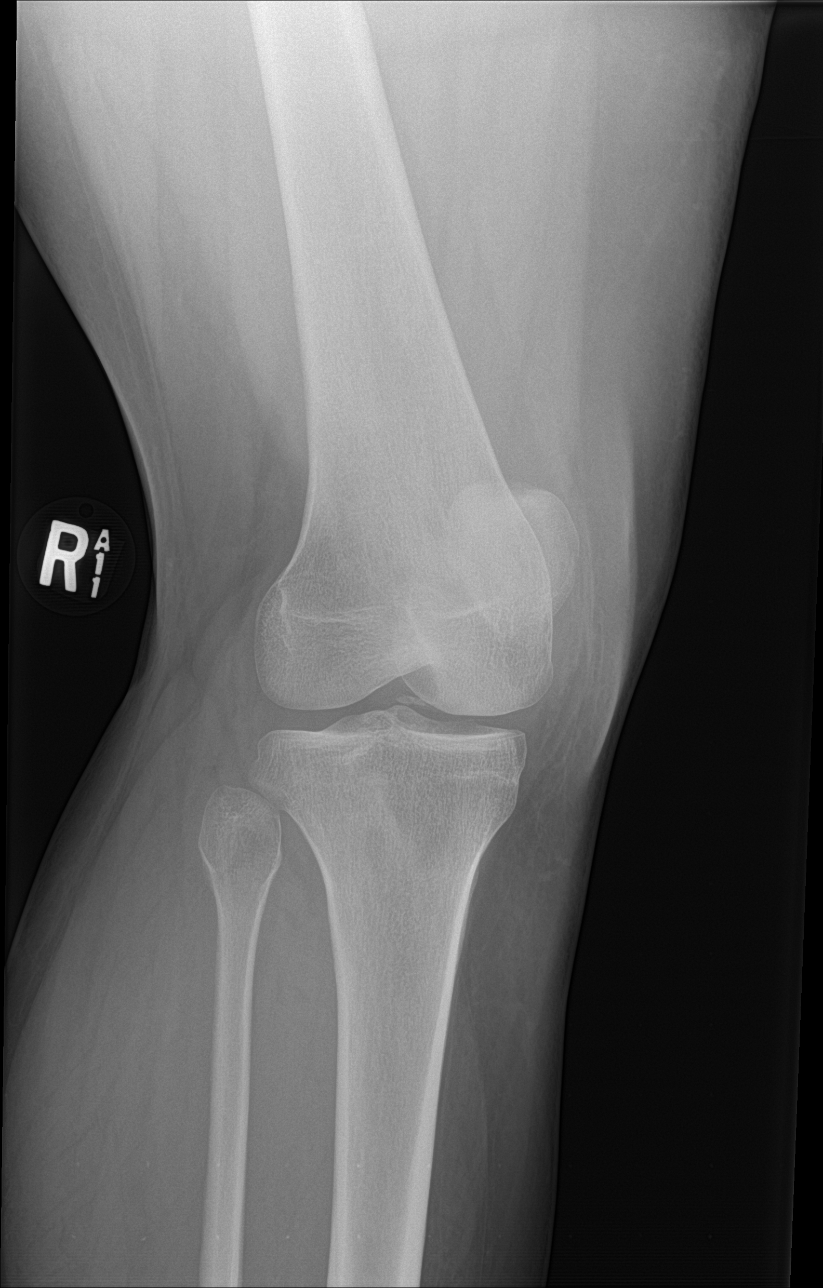

[knee obl (2 of 2)]
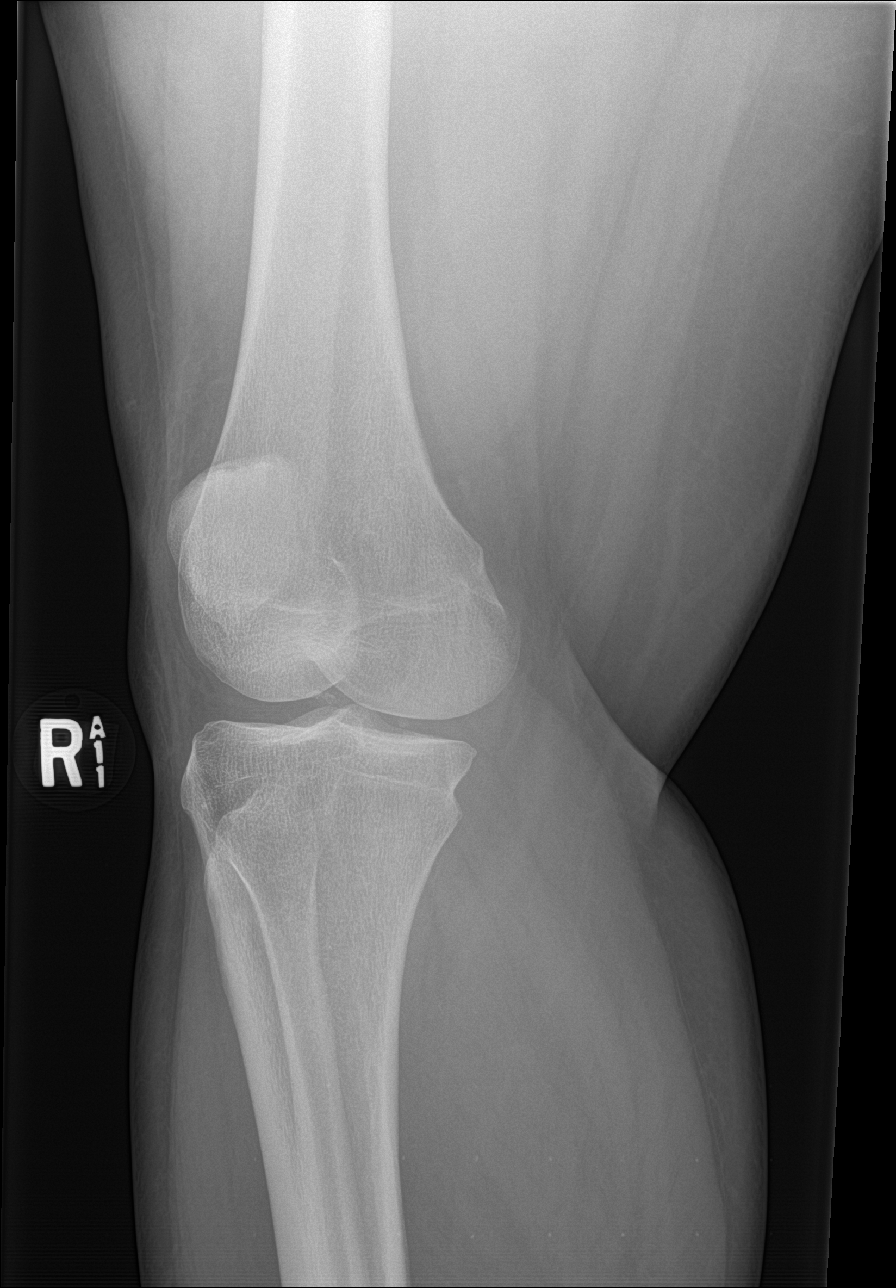

[4 of 4 positions shown; findings below may reference images not displayed]

FINDINGS: Small bony fragment in the anterior intercondylar notch which could
reflect ACL avulsion/injury. Small joint effusion. No significant
degenerative changes. Bone mineralization is normal. Soft tissues
are unremarkable.
IMPRESSION: Small bony fragment in the anterior intercondylar notch, which could
reflect ACL avulsion/injury. Recommend MRI for further evaluation.

## 2018-05-23 ENCOUNTER — Other Ambulatory Visit: Payer: Self-pay

## 2018-05-23 ENCOUNTER — Emergency Department
Admission: EM | Admit: 2018-05-23 | Discharge: 2018-05-23 | Disposition: A | Discharge status: Home / Self Care | Payer: Medicaid Other | Attending: Emergency Medicine | Admitting: Emergency Medicine

## 2018-05-23 DIAGNOSIS — Z79899 Other long term (current) drug therapy: Secondary | ICD-10-CM | POA: Insufficient documentation

## 2018-05-23 DIAGNOSIS — I10 Essential (primary) hypertension: Secondary | ICD-10-CM

## 2018-05-23 DIAGNOSIS — R51 Headache: Secondary | ICD-10-CM | POA: Diagnosis present

## 2018-05-23 DIAGNOSIS — G43009 Migraine without aura, not intractable, without status migrainosus: Secondary | ICD-10-CM

## 2018-05-23 MED ORDER — DIPHENHYDRAMINE HCL 50 MG/ML IJ SOLN
12.5000 mg | INTRAMUSCULAR | Status: AC
  Administered 2018-05-23: 12.5 mg via INTRAVENOUS
  Filled 2018-05-23: qty 1

## 2018-05-23 MED ORDER — METOCLOPRAMIDE HCL 5 MG/ML IJ SOLN
10.0000 mg | INTRAMUSCULAR | Status: AC
  Administered 2018-05-23: 10 mg via INTRAVENOUS
  Filled 2018-05-23: qty 2

## 2018-05-23 MED ORDER — SODIUM CHLORIDE 0.9 % IV BOLUS
1000.0000 mL | INTRAVENOUS | Status: AC
  Administered 2018-05-23: 1000 mL via INTRAVENOUS

## 2018-05-23 MED ORDER — BUTALBITAL-APAP-CAFFEINE 50-325-40 MG PO TABS: 1.0000 | ORAL_TABLET | Freq: Four times a day (QID) | ORAL | 0 refills | Status: AC | PRN

## 2018-05-23 MED ORDER — ACETAMINOPHEN 500 MG PO TABS
1000.0000 mg | ORAL_TABLET | Freq: Once | ORAL | Status: AC
  Administered 2018-05-23: 1000 mg via ORAL
  Filled 2018-05-23: qty 2

## 2018-05-23 MED ORDER — DEXAMETHASONE SODIUM PHOSPHATE 10 MG/ML IJ SOLN
10.0000 mg | Freq: Once | INTRAMUSCULAR | Status: AC
  Administered 2018-05-23: 10 mg via INTRAVENOUS
  Filled 2018-05-23: qty 1

## 2018-05-23 MED ORDER — KETOROLAC TROMETHAMINE 30 MG/ML IJ SOLN
15.0000 mg | Freq: Once | INTRAMUSCULAR | Status: AC
  Administered 2018-05-23: 15 mg via INTRAVENOUS
  Filled 2018-05-23: qty 1

## 2018-05-23 MED ORDER — ONDANSETRON 4 MG PO TBDP
4.0000 mg | ORAL_TABLET | Freq: Once | ORAL | Status: AC
  Administered 2018-05-23: 4 mg via ORAL
  Filled 2018-05-23: qty 1

## 2018-05-23 NOTE — ED Notes (Addendum)
Pt reports HA with N/V for the last two days, no migraine HA hx pt denies loss of sensation, coordination or facilities, pt appears sleepy but alert and oriented

## 2018-05-23 NOTE — ED Triage Notes (Signed)
Patient reports headache that started around 4 pm on Friday.  Patient reports nausea and vomiting.

## 2018-05-23 NOTE — ED Provider Notes (Signed)
[facility] Emergency Department Provider Note  ____________________________________________   First MD Initiated Contact with Patient 05/23/18 [phone]     (approximate)  I have reviewed the triage vital signs and the nursing notes.   HISTORY  Chief Complaint Headache    HPI Makayla Hernandez is a 32 y.o. female with medical history as listed below who has presented in the past for headaches but has no official diagnosis of headaches or migraines.  She presents for a generalized headache that started gradually about 12 hours prior to her arrival and has steadily gotten worse over that time..  She has had some nausea and vomiting.  She is sensitive to light and loud noise.  The pain is a throbbing pain throughout her head, dull and aching, nothing makes it better or worse.  She denies chest pain or shortness of breath, visual changes, abdominal pain, and dysuria.  It feels similar to prior headaches she has had in the past.  Past Medical History:  Diagnosis Date  . HTN (hypertension)     There are no active problems to display for this patient.   Past Surgical History:  Procedure Laterality Date  . CESAREAN SECTION    . TONSILLECTOMY    . TUBAL LIGATION      Prior to Admission medications   Medication Sig Start Date End Date Taking? Authorizing Provider  meloxicam (MOBIC) 15 MG tablet Take 1 tablet (15 mg total) by mouth daily. 08/29/17   Chinita Pester, FNP    Allergies Patient has no known allergies.  No family history on file.  Social History Social History   Tobacco Use  . Smoking status: Never Smoker  . Smokeless tobacco: Never Used  Substance Use Topics  . Alcohol use: No  . Drug use: No    Review of Systems Constitutional: No fever/chills Eyes: No visual changes. ENT: No sore throat. Cardiovascular: Denies chest pain. Respiratory: Denies shortness of breath. Gastrointestinal: No abdominal pain.  Nausea and vomiting associated  with her headache.  No diarrhea.  No constipation. Genitourinary: Negative for dysuria. Musculoskeletal: Negative for neck pain.  Negative for back pain. Integumentary: Negative for rash. Neurological: Generalized HA as described above, no numbness nor weakness in any of her extremities   ____________________________________________   PHYSICAL EXAM:  VITAL SIGNS: ED Triage Vitals  Enc Vitals Group     BP 05/23/18 0409 (!) 155/108     Pulse Rate 05/23/18 0409 81     Resp 05/23/18 0409 20     Temp 05/23/18 0409 98.3 F (36.8 C)     Temp Source 05/23/18 0409 Oral     SpO2 05/23/18 0409 99 %     Weight 05/23/18 0404 131.5 kg (290 lb)     Height 05/23/18 0404 1.702 m ( )     Head Circumference --      Peak Flow --      Pain Score 05/23/18 0406 10     Pain Loc --      Pain Edu? --      Excl. in GC? --     Constitutional: Alert and oriented. Well appearing and in no acute distress. Eyes: Conjunctivae are normal. PERRL. EOMI. Head: Atraumatic. Nose: No congestion/rhinnorhea. Mouth/Throat: Mucous membranes are moist. Neck: No stridor.  No meningeal signs.   Cardiovascular: Normal rate, regular rhythm. Good peripheral circulation. Grossly normal heart sounds. Respiratory: Normal respiratory effort.  No retractions. Lungs CTAB. Gastrointestinal: Soft and nontender. No distention.  Musculoskeletal: No  lower extremity tenderness nor edema. No gross deformities of extremities. Neurologic:  Normal speech and language. No gross focal neurologic deficits are appreciated.  Skin:  Skin is warm, dry and intact. No rash noted. Psychiatric: Mood and affect are normal. Speech and behavior are normal.  ____________________________________________   LABS (all labs ordered are listed, but only abnormal results are displayed)  Labs Reviewed - No data to display ____________________________________________  EKG  None - EKG not ordered by ED  physician ____________________________________________  RADIOLOGY   ED MD interpretation: No indication for imaging  Official radiology report(s): No results found.  ____________________________________________   PROCEDURES  Critical Care performed: No   Procedure(s) performed:   Procedures   ____________________________________________   INITIAL IMPRESSION / ASSESSMENT AND PLAN / ED COURSE  As part of my medical decision making, I reviewed the following data within the electronic MEDICAL RECORD NUMBER Nursing notes reviewed and incorporated, Old chart reviewed, Patient signed out to  and Notes from prior ED visits    Differential diagnosis includes, but is not limited to, intracranial hemorrhage, meningitis/encephalitis, previous head trauma, cavernous venous thrombosis, tension headache, temporal arteritis, migraine or migraine equivalent, idiopathic intracranial hypertension, and non-specific headache.  However, the patient has had similar presentations in the past including a relatively recent visit where she was treated with a "migraine cocktail" and it resolved both her headache and her high blood pressure.  I believe that her blood pressure is elevated because of the headache rather than vice versa.  She has no focal neurological issues.  I do not feel that a lumbar puncture is indicated or even recommended.  I will treat empirically with Reglan 10 mg IV, Benadryl 12.5 mg IV, Decadron 10 mg IV, Toradol 15 mg IV, Tylenol 1000 mg by mouth, and 1 L of normal saline by IV.  I anticipate that this will make her feel much better and she will be ready and appropriate for discharge and outpatient follow-up.  I discussed the case in person with Dr. Mayford Knife and I am signing out care to him to reassess her after the medication treatment to determine the appropriateness for discharge.      ____________________________________________  FINAL CLINICAL IMPRESSION(S) / ED  DIAGNOSES  Final diagnoses:  Migraine without aura and without status migrainosus, not intractable  Essential hypertension     MEDICATIONS GIVEN DURING THIS VISIT:  Medications  sodium chloride 0.9 % bolus 1,000 mL (1,000 mLs Intravenous New Bag/Given 05/23/18 0709)  ondansetron (ZOFRAN-ODT) disintegrating tablet 4 mg (4 mg Oral Given 05/23/18 0409)  acetaminophen (TYLENOL) tablet 1,000 mg (1,000 mg Oral Given 05/23/18 0700)  metoCLOPramide (REGLAN) injection 10 mg (10 mg Intravenous Given 05/23/18 0709)  dexamethasone (DECADRON) injection 10 mg (10 mg Intravenous Given 05/23/18 0710)  ketorolac (TORADOL) 30 MG/ML injection 15 mg (15 mg Intravenous Given 05/23/18 0711)  diphenhydrAMINE (BENADRYL) injection 12.5 mg (12.5 mg Intravenous Given 05/23/18 1610)     ED Discharge Orders    None       Note:  This document was prepared using Dragon voice recognition software and may include unintentional dictation errors.    Loleta Rose, MD 05/23/18 [phone]

## 2018-05-23 NOTE — ED Provider Notes (Signed)
Patient reports she is feeling better, she will be discharged with Fioricet and she is cleared for outpatient follow-up.   Emily Filbert, MD 05/23/18 [phone]

## 2018-05-23 NOTE — Discharge Instructions (Signed)

## 2020-12-13 ENCOUNTER — Other Ambulatory Visit: Payer: Self-pay

## 2020-12-13 ENCOUNTER — Emergency Department
Admission: EM | Admit: 2020-12-13 | Discharge: 2020-12-13 | Discharge status: Against Medical Advice | Payer: Medicaid Other

## 2020-12-13 NOTE — ED Notes (Signed)
No answer when called several times from lobby 

## 2021-10-28 ENCOUNTER — Other Ambulatory Visit: Payer: Self-pay

## 2021-10-28 ENCOUNTER — Emergency Department
Admission: EM | Admit: 2021-10-28 | Discharge: 2021-10-28 | Disposition: A | Discharge status: Home / Self Care | Payer: Medicaid Other | Attending: Emergency Medicine | Admitting: Emergency Medicine

## 2021-10-28 DIAGNOSIS — R519 Headache, unspecified: Secondary | ICD-10-CM | POA: Insufficient documentation

## 2021-10-28 DIAGNOSIS — G43909 Migraine, unspecified, not intractable, without status migrainosus: Secondary | ICD-10-CM | POA: Insufficient documentation

## 2021-10-28 DIAGNOSIS — R1110 Vomiting, unspecified: Secondary | ICD-10-CM | POA: Insufficient documentation

## 2021-10-28 DIAGNOSIS — Z5321 Procedure and treatment not carried out due to patient leaving prior to being seen by health care provider: Secondary | ICD-10-CM | POA: Insufficient documentation

## 2021-10-28 MED ORDER — ONDANSETRON 4 MG PO TBDP
4.0000 mg | ORAL_TABLET | Freq: Once | ORAL | Status: AC
  Administered 2021-10-28: 4 mg via ORAL
  Filled 2021-10-28: qty 1

## 2021-10-28 MED ORDER — KETOROLAC TROMETHAMINE 30 MG/ML IJ SOLN
60.0000 mg | Freq: Once | INTRAMUSCULAR | Status: AC
  Administered 2021-10-28: 60 mg via INTRAMUSCULAR
  Filled 2021-10-28: qty 2

## 2021-10-28 NOTE — ED Notes (Signed)
Called X2 to room patient with no answer.

## 2021-10-28 NOTE — ED Triage Notes (Signed)
Pt presents to ER with c/o HA that started yesterday and vomiting that started today.  Pt states she does not have hx of migraines.  Pt endorses sensitivity to lights and sounds.  No other neuro deficits at this time.

## 2021-10-28 NOTE — ED Triage Notes (Signed)
Pt called from [facility] to treatment room, no response 

## 2021-10-28 NOTE — ED Provider Notes (Signed)
Emergency Medicine Provider Triage Evaluation Note  Makayla Hernandez , a 35 y.o. female  was evaluated in triage.  Pt complains of headache and vomiting.  No h/o migraines.  No head injury.  No blood thinners.  No numbness, weakness.  No fever.  Tried advil and tylenol. Worse with lights and sounds.  Came on gradually.  Not sudden onset, thunderclap.  On menstrual cycle now.  Takes amlodipine 10mg  at 10:30 am for HTN.  Just started back on it 2 days ago.  Has had similar HA when her BP is elevated.  Drove herself to the ED.  Review of Systems  Positive: HA, nausea and vomiting Negative: Numbness, weakness, fever  Physical Exam  There were no vitals taken for this visit. Gen:   Awake, no distress   Resp:  Normal effort  MSK:   Moves extremities without difficulty  Other:  Normal speech, no facial droop  Medical Decision Making  Medically screening exam initiated at 3:41 AM.  Appropriate orders placed.  Makayla Hernandez was informed that the remainder of the evaluation will be completed by another provider, this initial triage assessment does not replace that evaluation, and the importance of remaining in the ED until their evaluation is complete.     Burlin Mcnair, Geraldine Solar, DO 10/28/21 0400

## 2022-05-21 ENCOUNTER — Encounter: Payer: Self-pay | Admitting: Emergency Medicine

## 2022-05-21 ENCOUNTER — Emergency Department
Admission: EM | Admit: 2022-05-21 | Discharge: 2022-05-21 | Disposition: A | Discharge status: Home / Self Care | Payer: Medicaid Other | Attending: Emergency Medicine | Admitting: Emergency Medicine

## 2022-05-21 DIAGNOSIS — I10 Essential (primary) hypertension: Secondary | ICD-10-CM

## 2022-05-21 DIAGNOSIS — Z79899 Other long term (current) drug therapy: Secondary | ICD-10-CM | POA: Insufficient documentation

## 2022-05-21 DIAGNOSIS — R519 Headache, unspecified: Secondary | ICD-10-CM

## 2022-05-21 LAB — COMPREHENSIVE METABOLIC PANEL
ALT: 15 U/L (ref 0–44)
AST: 26 U/L (ref 15–41)
Albumin: 3.6 g/dL (ref 3.5–5.0)
Alkaline Phosphatase: 56 U/L (ref 38–126)
Anion gap: 6 (ref 5–15)
BUN: 12 mg/dL (ref 6–20)
CO2: 25 mmol/L (ref 22–32)
Calcium: 8.9 mg/dL (ref 8.9–10.3)
Chloride: 107 mmol/L (ref 98–111)
Creatinine, Ser: 0.7 mg/dL (ref 0.44–1.00)
GFR, Estimated: >60 mL/min (ref >60)
Glucose, Bld: 104 mg/dL — ABNORMAL HIGH (ref 70–99)
Potassium: 3.6 mmol/L (ref 3.5–5.1)
Sodium: 138 mmol/L (ref 135–145)
Total Bilirubin: 0.5 mg/dL (ref 0.3–1.2)
Total Protein: 7.3 g/dL (ref 6.5–8.1)

## 2022-05-21 LAB — CBC WITH DIFFERENTIAL/PLATELET
Abs Immature Granulocytes: 0.02 10*3/uL (ref 0.00–0.07)
Basophils Absolute: 0 10*3/uL (ref 0.0–0.1)
Basophils Relative: 0 %
Eosinophils Absolute: 0 10*3/uL (ref 0.0–0.5)
Eosinophils Relative: 1 %
HCT: 34.7 % — ABNORMAL LOW (ref 36.0–46.0)
Hemoglobin: 9.9 g/dL — ABNORMAL LOW (ref 12.0–15.0)
Immature Granulocytes: 0 %
Lymphocytes Relative: 30 %
Lymphs Abs: 2 10*3/uL (ref 0.7–4.0)
MCH: 20.4 pg — ABNORMAL LOW (ref 26.0–34.0)
MCHC: 28.5 g/dL — ABNORMAL LOW (ref 30.0–36.0)
MCV: 71.4 fL — ABNORMAL LOW (ref 80.0–100.0)
Monocytes Absolute: 0.5 10*3/uL (ref 0.1–1.0)
Monocytes Relative: 7 %
Neutro Abs: 3.9 10*3/uL (ref 1.7–7.7)
Neutrophils Relative %: 62 %
Platelets: 114 10*3/uL — ABNORMAL LOW (ref 150–400)
RBC: 4.86 MIL/uL (ref 3.87–5.11)
RDW: 18.2 % — ABNORMAL HIGH (ref 11.5–15.5)
WBC: 6.4 10*3/uL (ref 4.0–10.5)
nRBC: 0 % (ref 0.0–0.2)

## 2022-05-21 MED ORDER — IBUPROFEN 600 MG PO TABS
600.0000 mg | ORAL_TABLET | Freq: Once | ORAL | Status: AC
  Administered 2022-05-21: 600 mg via ORAL
  Filled 2022-05-21: qty 1

## 2022-05-21 MED ORDER — DEXAMETHASONE SODIUM PHOSPHATE 10 MG/ML IJ SOLN
10.0000 mg | Freq: Once | INTRAMUSCULAR | Status: AC
  Administered 2022-05-21: 10 mg via INTRAMUSCULAR
  Filled 2022-05-21: qty 1

## 2022-05-21 MED ORDER — METOCLOPRAMIDE HCL 10 MG PO TABS
10.0000 mg | ORAL_TABLET | Freq: Once | ORAL | Status: AC
  Administered 2022-05-21: 10 mg via ORAL
  Filled 2022-05-21: qty 1

## 2022-05-21 MED ORDER — CYCLOBENZAPRINE HCL 10 MG PO TABS
10.0000 mg | ORAL_TABLET | Freq: Once | ORAL | Status: AC
  Administered 2022-05-21: 10 mg via ORAL
  Filled 2022-05-21: qty 1

## 2022-05-21 NOTE — ED Triage Notes (Signed)
Pt presents via POV with complaint of a headache that started yesterday with associated N/V. She notes taking OTC tylenol for pain which helped temporarily. Pt states she has a hx of HTN and has been non-complaint with her medications. Denies falls, injuries, or LOC.

## 2022-05-21 NOTE — Discharge Instructions (Signed)
Your exam and labs are normal at this time.  Take your blood pressure medicine as prescribed.  Follow with primary provider return to ED needed.

## 2022-05-21 NOTE — ED Provider Notes (Signed)
[facility] Emergency Department Provider Note     Event Date/Time   First MD Initiated Contact with Patient 05/21/22 2026     (approximate)   History   Headache   HPI  Makayla Hernandez is a 36 y.o. female with a history of poorly controlled hypertension, presents to the ED with reports of a persistent headache since yesterday.  Patient reports associated nausea and vomiting.  She denies any significant benefit with OTC Tylenol.  She denies any recent injury, trauma, fall.  She also denies any vision change, weakness, or syncope.  She believes symptoms are related to her blood pressure and reports less than a headache like this was due to not taking her blood pressure medicines.  She took a dose of her amlodipine prior to arrival with a slight improvement of her blood pressure.     Physical Exam   Triage Vital Signs: ED Triage Vitals  Enc Vitals Group     BP 05/21/22 1914 (!) 152/112     Pulse Rate 05/21/22 1914 74     Resp 05/21/22 1914 14     Temp 05/21/22 1914 98.6 F (37 C)     Temp Source 05/21/22 1914 Oral     SpO2 05/21/22 1914 100 %     Weight 05/21/22 1915 267 lb (121.1 kg)     Height 05/21/22 1915 5\' 7"  (1.702 m)     Head Circumference --      Peak Flow --      Pain Score 05/21/22 1912 7     Pain Loc --      Pain Edu? --      Excl. in [place]? --     Most recent vital signs: Vitals:   05/21/22 1914 05/21/22 2215  BP: (!) 152/112 127/75  Pulse: 74 66  Resp: 14 16  Temp: 98.6 F (37 C) 98.1 F (36.7 C)  SpO2: 100% 97%    General Awake, no distress. NAD HEENT NCAT. PERRL. EOMI. No rhinorrhea. Mucous membranes are moist.  CV:  Good peripheral perfusion. RRR RESP:  Normal effort.  ABD:  No distention.    ED Results / Procedures / Treatments   Labs (all labs ordered are listed, but only abnormal results are displayed) Labs Reviewed  CBC WITH DIFFERENTIAL/PLATELET - Abnormal; Notable for the following components:      Result  Value   Hemoglobin 9.9 (*)    HCT 34.7 (*)    MCV 71.4 (*)    MCH 20.4 (*)    MCHC 28.5 (*)    RDW 18.2 (*)    Platelets 114 (*)    All other components within normal limits  COMPREHENSIVE METABOLIC PANEL - Abnormal; Notable for the following components:   Glucose, Bld 104 (*)    All other components within normal limits  URINALYSIS, ROUTINE W REFLEX MICROSCOPIC     EKG   RADIOLOGY   No results found.   PROCEDURES:  Critical Care performed: No  Procedures   MEDICATIONS ORDERED IN ED: Medications  ibuprofen (ADVIL) tablet 600 mg (600 mg Oral Given 05/21/22 1926)  metoCLOPramide (REGLAN) tablet 10 mg (10 mg Oral Given 05/21/22 2114)  cyclobenzaprine (FLEXERIL) tablet 10 mg (10 mg Oral Given 05/21/22 2114)  dexamethasone (DECADRON) injection 10 mg (10 mg Intramuscular Given 05/21/22 2114)     IMPRESSION / MDM / ASSESSMENT AND PLAN / ED COURSE  I reviewed the triage vital signs and the nursing notes.  Differential diagnosis includes, but is not limited to, acute headache, TIA, sinusitis  Patient to the ED for evaluation of acute headache pain and elevated blood pressure.  Patient presents in no acute distress for complaints.  No signs of any acute neuromuscular deficit, weakness, paralysis, or visual disturbance.  Patient is treated in the ED with medications including nausea medicine, muscle relaxants, and steroid.  She reports intermittent but never headache pain at the time of this disposition.  Patient's diagnosis is consistent with headache and high blood pressure. Patient will be discharged home with instruction to take her home BP meds. Patient is to follow up with her PCP as needed or otherwise directed. Patient is given ED precautions to return to the ED for any worsening or new symptoms.  FINAL CLINICAL IMPRESSION(S) / ED DIAGNOSES   Final diagnoses:  Primary hypertension  Acute nonintractable headache, unspecified headache type      Rx / DC Orders   ED Discharge Orders     None        Note:  This document was prepared using Dragon voice recognition software and may include unintentional dictation errors.    Melvenia Needles, PA-C 05/21/22 2240    Naaman Plummer, MD 05/21/22 [phone]
